# Patient Record
Sex: Male | Born: 2000
Health system: Southern US, Community
[De-identification: ages and names within clinical notes are randomized; demographics above are authoritative.]

## PROBLEM LIST (undated history)

## (undated) ENCOUNTER — Emergency Department (HOSPITAL_COMMUNITY): Admission: EM | Payer: Self-pay | Source: Home / Self Care

## (undated) ENCOUNTER — Ambulatory Visit (HOSPITAL_COMMUNITY): Admission: EM | Payer: Medicaid Other | Source: Home / Self Care

## (undated) DIAGNOSIS — E669 Obesity, unspecified: Secondary | ICD-10-CM

## (undated) DIAGNOSIS — R51 Headache: Secondary | ICD-10-CM

## (undated) DIAGNOSIS — R519 Headache, unspecified: Secondary | ICD-10-CM

## (undated) HISTORY — PX: TONSILLECTOMY: SUR1361

## (undated) HISTORY — DX: Headache: R51

## (undated) HISTORY — DX: Headache, unspecified: R51.9

---
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
## 2020-01-16 DIAGNOSIS — Z419 Encounter for procedure for purposes other than remedying health state, unspecified: Secondary | ICD-10-CM | POA: Diagnosis not present

## 2020-02-16 DIAGNOSIS — Z419 Encounter for procedure for purposes other than remedying health state, unspecified: Secondary | ICD-10-CM | POA: Diagnosis not present

## 2020-03-18 DIAGNOSIS — Z419 Encounter for procedure for purposes other than remedying health state, unspecified: Secondary | ICD-10-CM | POA: Diagnosis not present

## 2020-04-17 DIAGNOSIS — Z419 Encounter for procedure for purposes other than remedying health state, unspecified: Secondary | ICD-10-CM | POA: Diagnosis not present

## 2020-05-18 DIAGNOSIS — Z419 Encounter for procedure for purposes other than remedying health state, unspecified: Secondary | ICD-10-CM | POA: Diagnosis not present

## 2020-06-17 DIAGNOSIS — Z419 Encounter for procedure for purposes other than remedying health state, unspecified: Secondary | ICD-10-CM | POA: Diagnosis not present

## 2020-07-18 DIAGNOSIS — Z419 Encounter for procedure for purposes other than remedying health state, unspecified: Secondary | ICD-10-CM | POA: Diagnosis not present

## 2020-08-18 DIAGNOSIS — Z419 Encounter for procedure for purposes other than remedying health state, unspecified: Secondary | ICD-10-CM | POA: Diagnosis not present

## 2020-09-15 DIAGNOSIS — Z419 Encounter for procedure for purposes other than remedying health state, unspecified: Secondary | ICD-10-CM | POA: Diagnosis not present

## 2020-10-16 DIAGNOSIS — Z419 Encounter for procedure for purposes other than remedying health state, unspecified: Secondary | ICD-10-CM | POA: Diagnosis not present

## 2020-11-15 DIAGNOSIS — Z419 Encounter for procedure for purposes other than remedying health state, unspecified: Secondary | ICD-10-CM | POA: Diagnosis not present

## 2020-11-19 ENCOUNTER — Encounter (INDEPENDENT_AMBULATORY_CARE_PROVIDER_SITE_OTHER): Payer: Self-pay

## 2020-12-16 DIAGNOSIS — Z419 Encounter for procedure for purposes other than remedying health state, unspecified: Secondary | ICD-10-CM | POA: Diagnosis not present

## 2021-01-15 DIAGNOSIS — Z419 Encounter for procedure for purposes other than remedying health state, unspecified: Secondary | ICD-10-CM | POA: Diagnosis not present

## 2021-01-26 ENCOUNTER — Other Ambulatory Visit: Payer: Self-pay

## 2021-01-26 ENCOUNTER — Ambulatory Visit (HOSPITAL_COMMUNITY)
Admission: EM | Admit: 2021-01-26 | Discharge: 2021-01-26 | Disposition: A | Discharge status: Home / Self Care | Payer: Medicaid Other | Attending: Internal Medicine | Admitting: Internal Medicine

## 2021-01-26 ENCOUNTER — Encounter (HOSPITAL_COMMUNITY): Payer: Self-pay

## 2021-01-26 DIAGNOSIS — R0789 Other chest pain: Secondary | ICD-10-CM

## 2021-01-26 DIAGNOSIS — R109 Unspecified abdominal pain: Secondary | ICD-10-CM

## 2021-01-26 MED ORDER — DICYCLOMINE HCL 20 MG PO TABS: 20.0000 mg | ORAL_TABLET | Freq: Two times a day (BID) | ORAL | 0 refills | Status: DC | PRN

## 2021-01-26 NOTE — ED Triage Notes (Addendum)
Pt c/o pain in lower abdomen off and on for years, and pain in left side of chest off and on for months. States chest pain recently got worse on Saturday, with a stabbing pain on the left side of chest. States chest pain was previously transient in nature but that this recent pain was more consistent and worsened with movement. Denies any active pain in chest or abdomen.  Pt also endorses pain in back of right shoulder. Also reports some trouble using the bathroom. Pt reports has tried taking prilosec, tums without relief and pepto bismol with some relief of abdominal pain.

## 2021-01-26 NOTE — Discharge Instructions (Addendum)
Please call cardiology and gastroenterology numbers provided tomorrow to make appointment for further evaluation and management of chronic chest pain and chronic abdominal pain.  Primary care assistance has been requested for you.  Someone will reach out to you to schedule an appointment for primary care physician.  Please take prescribed medication as needed for abdominal cramping.  Please go to the hospital if any symptoms including chest pain, shortness of breath, abdominal pain worsen.

## 2021-01-26 NOTE — ED Provider Notes (Signed)
[facility]    CSN: [id number] Arrival date & time: 01/26/21  1648      History   Chief Complaint Chief Complaint  Patient presents with   Chest Pain   Abdominal Pain   Shoulder Pain    HPI Larry Reed is a 20 y.o. male.   Patient presents with intermittent chest pain that has been present for approximately 2 years.  Chest pain is intermittent and is described as a sharp pain that occurs to the left side of the chest.  Pain occurs with taking deep breaths at times.  Pain also occurs with movement at times.  Patient denies any current chest pain.  Denies any shortness of breath.  Denies any blurred vision, nausea, headache, dizziness.  Denies any cardiac history or significant lung history.  Patient also presents for evaluation of generalized abdominal pain that has been present for multiple years as well.  Patient describes the abdominal pain as generalized and cramping.  Pain worsens when eating.  Denies any constipation but states that he does have diarrhea approximately 4 times a day that has also been present for multiple years.  Denies any blood in stool.  Denies any nausea or vomiting.  Denies any urinary burning or frequency.  Patient states that he has been meaning to have these problems evaluated by primary care physician but does not have one.  Patient is requesting assistance finding a primary care physician.   Chest Pain Abdominal Pain Shoulder Pain  Past Medical History:  Diagnosis Date   Headache    Obesity     Patient Active Problem List   Diagnosis Date Noted   Migraine with aura and without status migrainosus 10/29/2018   Episodic tension-type headache, not intractable 10/29/2018    Past Surgical History:  Procedure Laterality Date   TONSILLECTOMY         Home Medications    Prior to Admission medications   Medication Sig Start Date End Date Taking? Authorizing Provider  dicyclomine (BENTYL) 20 MG tablet Take 1 tablet (20 mg  total) by mouth 2 (two) times daily as needed for spasms (Abdominal pain and cramping). 01/26/21  Yes Lance Muss, FNP  ibuprofen (ADVIL,MOTRIN) 600 MG tablet Take 1 tablet (600 mg total) by mouth every 6 (six) hours as needed. 05/07/18  Yes Linus Mako B, NP  MIGRELIEF 200-180-50 MG TABS Take 2 tablets daily 10/29/18   Deetta Perla, MD  ondansetron (ZOFRAN ODT) 4 MG disintegrating tablet Take 1 tablet (4 mg total) by mouth every 6 (six) hours as needed. 04/25/19   Ward, Layla Maw, DO  oxyCODONE-acetaminophen (PERCOCET/ROXICET) 5-325 MG tablet Take 1 tablet by mouth every 4 (four) hours as needed. 04/25/19   Ward, Layla Maw, DO    Family History Family History  Problem Relation Age of Onset   Heart attack Maternal Grandfather    Hypertension Paternal Grandfather    Heart attack Paternal Grandfather     Social History Social History   Tobacco Use   Smoking status: Some Days    Pack years: 0.00    Types: Cigarettes   Smokeless tobacco: Former    Types: Associate Professor Use: Every day   Substances: Nicotine  Substance Use Topics   Alcohol use: No    Alcohol/week: 0.0 standard drinks   Drug use: Yes    Types: Marijuana    Comment: smokes marijuana daily     Allergies   Patient has no known  allergies.   Review of Systems Review of Systems Per HPI  Physical Exam Triage Vital Signs ED Triage Vitals  Enc Vitals Group     BP 01/26/21 1906 (S) (!) 121/50     Pulse Rate 01/26/21 1906 75     Resp 01/26/21 1906 18     Temp 01/26/21 1906 98.2 F (36.8 C)     Temp src --      SpO2 01/26/21 1906 100 %     Weight --      Height --      Head Circumference --      Peak Flow --      Pain Score 01/26/21 1908 0     Pain Loc --      Pain Edu? --      Excl. in GC? --    No data found.  Updated Vital Signs BP (S) (!) 121/50   Pulse 75   Temp 98.2 F (36.8 C)   Resp 18   SpO2 100%   Visual Acuity Right Eye Distance:   Left Eye Distance:    Bilateral Distance:    Right Eye Near:   Left Eye Near:    Bilateral Near:     Physical Exam Constitutional:      General: He is not in acute distress.    Appearance: Normal appearance. He is obese. He is not ill-appearing or toxic-appearing.  HENT:     Head: Normocephalic and atraumatic.  Eyes:     Extraocular Movements: Extraocular movements intact.     Conjunctiva/sclera: Conjunctivae normal.  Cardiovascular:     Rate and Rhythm: Normal rate and regular rhythm.     Heart sounds: Normal heart sounds.  Pulmonary:     Effort: Pulmonary effort is normal.     Breath sounds: Normal breath sounds.  Chest:     Chest wall: No tenderness.  Abdominal:     General: Bowel sounds are normal.     Palpations: Abdomen is soft.     Tenderness: There is no abdominal tenderness.  Skin:    General: Skin is warm and dry.  Neurological:     General: No focal deficit present.     Mental Status: He is alert and oriented to person, place, and time. Mental status is at baseline.  Psychiatric:        Mood and Affect: Mood normal.        Behavior: Behavior normal.        Thought Content: Thought content normal.        Judgment: Judgment normal.     UC Treatments / Results  Labs (all labs ordered are listed, but only abnormal results are displayed) Labs Reviewed - No data to display  EKG   Radiology No results found.  Procedures Procedures (including critical care time)  Medications Ordered in UC Medications - No data to display  Initial Impression / Assessment and Plan / UC Course  I have reviewed the triage vital signs and the nursing notes.  Pertinent labs & imaging results that were available during my care of the patient were reviewed by me and considered in my medical decision making (see chart for details).     EKG showing sinus rhythm with sinus arrhythmia.  Possible differential diagnosis for left chest pain include pleurisy, muscular pain, or pain related to sinus  arrhythmia.  Low suspicion due to clinical signs and symptoms of pain that is related to cardiac reasoning.  Due to chronicity of pain  and no current chest pain, do not think patient needs to be mediately further evaluated at the hospital at this time.  Referral to cardiology for further evaluation and management.  Patient was given contact information for cardiologist.  Clinical signs and symptoms and mechanism of abdominal pain most consistent with irritable bowel syndrome or chronic gastrointestinal problems such as Crohn's.  Patient was prescribed Bentyl as needed for abdominal pain and cramping.  Referral made to gastroenterology.  Patient was given contact information to follow-up to be further evaluated.  Patient was advised to go to the hospital if any chest pain or abdominal pain significantly worsens.  Patient was also advised to go to the hospital if any shortness of breath develop.  PCP assistance was requested for patient.  Discussed strict return precautions. Patient verbalized understanding and is agreeable with plan.  Final Clinical Impressions(s) / UC Diagnoses   Final diagnoses:  Other chest pain  Abdominal cramping     Discharge Instructions      Please call cardiology and gastroenterology numbers provided tomorrow to make appointment for further evaluation and management of chronic chest pain and chronic abdominal pain.  Primary care assistance has been requested for you.  Someone will reach out to you to schedule an appointment for primary care physician.  Please take prescribed medication as needed for abdominal cramping.  Please go to the hospital if any symptoms including chest pain, shortness of breath, abdominal pain worsen.     ED Prescriptions     Medication Sig Dispense Auth. Provider   dicyclomine (BENTYL) 20 MG tablet Take 1 tablet (20 mg total) by mouth 2 (two) times daily as needed for spasms (Abdominal pain and cramping). 20 tablet Lance Muss,  FNP      PDMP not reviewed this encounter.   Lance Muss, FNP 01/26/21 2203

## 2021-01-27 ENCOUNTER — Encounter (HOSPITAL_COMMUNITY): Payer: Self-pay

## 2021-02-15 DIAGNOSIS — Z419 Encounter for procedure for purposes other than remedying health state, unspecified: Secondary | ICD-10-CM | POA: Diagnosis not present

## 2021-03-10 ENCOUNTER — Emergency Department (HOSPITAL_COMMUNITY)
Admission: EM | Admit: 2021-03-10 | Discharge: 2021-03-10 | Disposition: A | Discharge status: Home / Self Care | Payer: Medicaid Other | Attending: Emergency Medicine | Admitting: Emergency Medicine

## 2021-03-10 ENCOUNTER — Emergency Department (HOSPITAL_COMMUNITY): Payer: Medicaid Other

## 2021-03-10 ENCOUNTER — Encounter (HOSPITAL_COMMUNITY): Payer: Self-pay | Admitting: Emergency Medicine

## 2021-03-10 ENCOUNTER — Other Ambulatory Visit: Payer: Self-pay

## 2021-03-10 DIAGNOSIS — R0602 Shortness of breath: Secondary | ICD-10-CM | POA: Diagnosis not present

## 2021-03-10 DIAGNOSIS — R0789 Other chest pain: Secondary | ICD-10-CM | POA: Insufficient documentation

## 2021-03-10 DIAGNOSIS — F17210 Nicotine dependence, cigarettes, uncomplicated: Secondary | ICD-10-CM | POA: Diagnosis not present

## 2021-03-10 DIAGNOSIS — R079 Chest pain, unspecified: Secondary | ICD-10-CM | POA: Diagnosis not present

## 2021-03-10 LAB — COMPREHENSIVE METABOLIC PANEL
ALT: 19 U/L (ref 0–44)
AST: 15 U/L (ref 15–41)
Albumin: 4.2 g/dL (ref 3.5–5.0)
Alkaline Phosphatase: 85 U/L (ref 38–126)
Anion gap: 7 (ref 5–15)
BUN: 10 mg/dL (ref 6–20)
CO2: 26 mmol/L (ref 22–32)
Calcium: 8.9 mg/dL (ref 8.9–10.3)
Chloride: 106 mmol/L (ref 98–111)
Creatinine, Ser: 0.74 mg/dL (ref 0.61–1.24)
GFR, Estimated: >60 mL/min (ref >60)
Glucose, Bld: 97 mg/dL (ref 70–99)
Potassium: 4 mmol/L (ref 3.5–5.1)
Sodium: 139 mmol/L (ref 135–145)
Total Bilirubin: 0.5 mg/dL (ref 0.3–1.2)
Total Protein: 7.2 g/dL (ref 6.5–8.1)

## 2021-03-10 LAB — CBC
HCT: 47.4 % (ref 39.0–52.0)
Hemoglobin: 15.5 g/dL (ref 13.0–17.0)
MCH: 29.3 pg (ref 26.0–34.0)
MCHC: 32.7 g/dL (ref 30.0–36.0)
MCV: 89.6 fL (ref 80.0–100.0)
Platelets: 297 10*3/uL (ref 150–400)
RBC: 5.29 MIL/uL (ref 4.22–5.81)
RDW: 12.5 % (ref 11.5–15.5)
WBC: 9.7 10*3/uL (ref 4.0–10.5)
nRBC: 0 % (ref 0.0–0.2)

## 2021-03-10 LAB — LIPASE, BLOOD: Lipase: 27 U/L (ref 11–51)

## 2021-03-10 LAB — TROPONIN I (HIGH SENSITIVITY)
Troponin I (High Sensitivity): <2 ng/L (ref <18)
Troponin I (High Sensitivity): <2 ng/L (ref <18)

## 2021-03-10 MED ORDER — SODIUM CHLORIDE 0.9 % IV BOLUS
1000.0000 mL | Freq: Once | INTRAVENOUS | Status: AC
  Administered 2021-03-10: 1000 mL via INTRAVENOUS

## 2021-03-10 MED ORDER — ONDANSETRON 4 MG PO TBDP: 4.0000 mg | ORAL_TABLET | Freq: Three times a day (TID) | ORAL | 0 refills | Status: DC | PRN

## 2021-03-10 MED ORDER — ONDANSETRON HCL 4 MG/2ML IJ SOLN
4.0000 mg | Freq: Once | INTRAMUSCULAR | Status: AC
  Administered 2021-03-10: 4 mg via INTRAVENOUS
  Filled 2021-03-10: qty 2

## 2021-03-10 MED ORDER — SODIUM CHLORIDE 0.9 % IV SOLN
12.5000 mg | Freq: Once | INTRAVENOUS | Status: DC
  Filled 2021-03-10: qty 0.5

## 2021-03-10 MED ORDER — NAPROXEN 250 MG PO TABS
500.0000 mg | ORAL_TABLET | Freq: Once | ORAL | Status: AC
  Administered 2021-03-10: 500 mg via ORAL
  Filled 2021-03-10: qty 2

## 2021-03-10 NOTE — Discharge Instructions (Addendum)
It was a pleasure taking care of you today. As discussed, all of your labs were reassuring. You heart labs were all normal. I have included the number of the cardiologist. Call to schedule an appointment for further evaluation. Follow-up with PCP within the next week. Return to the ER for new or worsening symptoms.  I am sending you home with nausea medication. Take as needed for nausea.

## 2021-03-10 NOTE — ED Provider Notes (Signed)
[facility] EMERGENCY DEPARTMENT Provider Note   CSN: [id number] Arrival date & time: 03/10/21  1275     History Chief Complaint  Patient presents with   Chest Pain    Larry Reed is a 20 y.o. male who presents to the ED due to left-sided chest.  Patient notes chest pain has been intermittent for the past few months.  Patient was evaluated at urgent care beginning of July where he was referred to cardiology.  Patient states that this morning around 9 AM he became acutely dizzy and nauseous and began having left-sided, nonradiating chest pain.  Patient describes chest pain as his "left breast being squeezed". Patient endorses mild associated shortness of chest pain.  Patient states pain is worse when lifting left arm. Mild pleuritic nature. No history of PE. Denies associated lower extremities edema. He notes he has not been seen by cardiology yet. No treatment prior to arrival. Patient is currently chest pain free.   History obtained from patient and past medical records. No interpreter used during encounter.       Past Medical History:  Diagnosis Date   Headache    Obesity     Patient Active Problem List   Diagnosis Date Noted   Migraine with aura and without status migrainosus 10/29/2018   Episodic tension-type headache, not intractable 10/29/2018    Past Surgical History:  Procedure Laterality Date   TONSILLECTOMY         Family History  Problem Relation Age of Onset   Heart attack Maternal Grandfather    Hypertension Paternal Grandfather    Heart attack Paternal Grandfather     Social History   Tobacco Use   Smoking status: Some Days    Packs/day: 0.50    Types: Cigarettes   Smokeless tobacco: Former    Types: Associate Professor Use: Every day   Substances: Nicotine  Substance Use Topics   Alcohol use: No    Alcohol/week: 0.0 standard drinks   Drug use: Yes    Types: Marijuana    Comment: smokes marijuana daily    Home  Medications Prior to Admission medications   Medication Sig Start Date End Date Taking? Authorizing Provider  ibuprofen (ADVIL) 200 MG tablet Take 200 mg by mouth every 6 (six) hours as needed for fever, mild pain or headache.   Yes [provider]  ondansetron (ZOFRAN ODT) 4 MG disintegrating tablet Take 1 tablet (4 mg total) by mouth every 8 (eight) hours as needed for nausea or vomiting. 03/10/21  Yes Rueben Kassim, Merla Riches, PA-C  dicyclomine (BENTYL) 20 MG tablet Take 1 tablet (20 mg total) by mouth 2 (two) times daily as needed for spasms (Abdominal pain and cramping). Patient not taking: Reported on 03/10/2021 01/26/21   Lance Muss, FNP  ibuprofen (ADVIL,MOTRIN) 600 MG tablet Take 1 tablet (600 mg total) by mouth every 6 (six) hours as needed. Patient not taking: Reported on 03/10/2021 05/07/18   Linus Mako B, NP  MIGRELIEF 200-180-50 MG TABS Take 2 tablets daily Patient not taking: Reported on 03/10/2021 10/29/18   Deetta Perla, MD  ondansetron (ZOFRAN ODT) 4 MG disintegrating tablet Take 1 tablet (4 mg total) by mouth every 6 (six) hours as needed. Patient not taking: Reported on 03/10/2021 04/25/19   Ward, Layla Maw, DO  oxyCODONE-acetaminophen (PERCOCET/ROXICET) 5-325 MG tablet Take 1 tablet by mouth every 4 (four) hours as needed. Patient not taking: Reported on 03/10/2021 04/25/19   Ward, Baxter Hire  N, DO    Allergies    Patient has no known allergies.  Review of Systems   Review of Systems  Constitutional:  Negative for chills and fever.  Respiratory:  Positive for shortness of breath. Negative for cough.   Cardiovascular:  Positive for chest pain. Negative for leg swelling.  Gastrointestinal:  Positive for nausea. Negative for abdominal pain, diarrhea and vomiting.  All other systems reviewed and are negative.  Physical Exam Updated Vital Signs BP 115/61   Pulse 60   Temp 98 F (36.7 C) (Oral)   Resp 16   Ht 5\' 6"  (1.676 m)   Wt 122.5 kg   SpO2 100%   BMI  43.58 kg/m   Physical Exam Vitals and nursing note reviewed.  Constitutional:      General: He is not in acute distress.    Appearance: He is not ill-appearing.  HENT:     Head: Normocephalic.  Eyes:     Pupils: Pupils are equal, round, and reactive to light.  Cardiovascular:     Rate and Rhythm: Normal rate and regular rhythm.     Pulses: Normal pulses.     Heart sounds: Normal heart sounds. No murmur heard.   No friction rub. No gallop.  Pulmonary:     Effort: Pulmonary effort is normal.     Breath sounds: Normal breath sounds.  Chest:     Comments: Reproducible left-sided chest wall tenderness without crepitus or deformity.  Abdominal:     General: Abdomen is flat. There is no distension.     Palpations: Abdomen is soft.     Tenderness: no abdominal tenderness There is no guarding or rebound.  Musculoskeletal:        General: Normal range of motion.     Cervical back: Neck supple.     Comments: No lower extremity edema. Negative homan sign bilaterally  Skin:    General: Skin is warm and dry.  Neurological:     General: No focal deficit present.     Mental Status: He is alert.  Psychiatric:        Mood and Affect: Mood normal.        Behavior: Behavior normal.    ED Results / Procedures / Treatments   Labs (all labs ordered are listed, but only abnormal results are displayed) Labs Reviewed  CBC  COMPREHENSIVE METABOLIC PANEL  LIPASE, BLOOD  TROPONIN I (HIGH SENSITIVITY)  TROPONIN I (HIGH SENSITIVITY)    EKG None  Radiology DG Chest 2 View  Result Date: 03/10/2021 CLINICAL DATA:  20 year old male with history of left-sided chest pain intermittently. EXAM: CHEST - 2 VIEW COMPARISON:  Chest x-ray 04/25/2019. FINDINGS: Lung volumes are normal. No consolidative airspace disease. No pleural effusions. No pneumothorax. No pulmonary nodule or mass noted. Pulmonary vasculature and the cardiomediastinal silhouette are within normal limits. IMPRESSION: No radiographic  evidence of acute cardiopulmonary disease. Electronically Signed   By: 06/25/2019 M.D.   On: 03/10/2021 10:33    Procedures Procedures   Medications Ordered in ED Medications  promethazine (PHENERGAN) 12.5 mg in sodium chloride 0.9 % 50 mL IVPB (has no administration in time range)  sodium chloride 0.9 % bolus 1,000 mL (1,000 mLs Intravenous New Bag/Given 03/10/21 1411)  ondansetron (ZOFRAN) injection 4 mg (4 mg Intravenous Given 03/10/21 1411)  naproxen (NAPROSYN) tablet 500 mg (500 mg Oral Given 03/10/21 1406)    ED Course  I have reviewed the triage vital signs and the nursing notes.  Pertinent  labs & imaging results that were available during my care of the patient were reviewed by me and considered in my medical decision making (see chart for details).    MDM Rules/Calculators/A&P                          20 year old male presents to the ED due to left-sided chest pain that has been intermittent for the past few months. Chest pain worse when raising left arm. He is a Psychiatric nurse. Admits to mild shortness of breath and nausea with chest pain. Patient was evaluated at [facility] beginning of July for same complaint and referred to cardiology who patient has not followed up with yet. Upon arrival, vitals all within normal limits. Patient is afebrile, not tachycardic or hypoxic. Patient in no acute distress. Physical exam significant for reproducible left-sided chest pain without crepitus or deformity. No lower extremity edema. PERC negative and low risk using well's criteria. Low suspicion for PE/DVT. Cardiac and abdominal labs ordered at triage. IVFs and Zofran given. Possible MSK etiology given reproducible nature on exam. Naproxen given.   CBC and CMP unremarkable. No leukocytosis. Normal renal function.  No major electrolyte derangements.  Delta troponin flat.  Low suspicion for ACS.  Lipase normal.  Discussion of pancreatitis.  Chest x-ray is negative for any acute abnormalities.  EKG  returns normal sinus rhythm with sinus arrhythmia.  No acute ST changes. Patient endorses improvement after nausea medication, IVFs, and naproxen. Presentation  non-concerning for dissection. Low suspicion for cardiac etiology given age and lack of risk factors; however, given longevity of symptoms, will discharge patient with cardiology referral. Advised patient to call to schedule an appointment for further evaluation. Strict ED precautions discussed with patient. Patient states understanding and agrees to plan. Patient discharged home in no acute distress and stable vitals  Final Clinical Impression(s) / ED Diagnoses Final diagnoses:  Atypical chest pain    Rx / DC Orders ED Discharge Orders          Ordered    ondansetron (ZOFRAN ODT) 4 MG disintegrating tablet  Every 8 hours PRN        03/10/21 1524             Jesusita Oka 03/10/21 1544    Bethann Berkshire, MD 03/12/21 [phone]

## 2021-03-10 NOTE — ED Triage Notes (Signed)
Pt to the ED with Left side chest discomfort with nausea and vomiting that began at 0900 this morning.  Pt states this intermittent chest pain for the last 2-3 months.

## 2021-03-18 DIAGNOSIS — Z419 Encounter for procedure for purposes other than remedying health state, unspecified: Secondary | ICD-10-CM | POA: Diagnosis not present

## 2021-04-17 DIAGNOSIS — Z419 Encounter for procedure for purposes other than remedying health state, unspecified: Secondary | ICD-10-CM | POA: Diagnosis not present

## 2021-04-20 ENCOUNTER — Ambulatory Visit: Payer: Medicaid Other | Admitting: Cardiovascular Disease

## 2021-05-18 DIAGNOSIS — Z419 Encounter for procedure for purposes other than remedying health state, unspecified: Secondary | ICD-10-CM | POA: Diagnosis not present

## 2021-06-17 DIAGNOSIS — Z419 Encounter for procedure for purposes other than remedying health state, unspecified: Secondary | ICD-10-CM | POA: Diagnosis not present

## 2021-07-18 DIAGNOSIS — Z419 Encounter for procedure for purposes other than remedying health state, unspecified: Secondary | ICD-10-CM | POA: Diagnosis not present

## 2021-08-18 DIAGNOSIS — Z419 Encounter for procedure for purposes other than remedying health state, unspecified: Secondary | ICD-10-CM | POA: Diagnosis not present

## 2021-09-15 DIAGNOSIS — Z419 Encounter for procedure for purposes other than remedying health state, unspecified: Secondary | ICD-10-CM | POA: Diagnosis not present

## 2021-10-16 DIAGNOSIS — Z419 Encounter for procedure for purposes other than remedying health state, unspecified: Secondary | ICD-10-CM | POA: Diagnosis not present

## 2021-11-15 DIAGNOSIS — Z419 Encounter for procedure for purposes other than remedying health state, unspecified: Secondary | ICD-10-CM | POA: Diagnosis not present

## 2021-12-16 DIAGNOSIS — Z419 Encounter for procedure for purposes other than remedying health state, unspecified: Secondary | ICD-10-CM | POA: Diagnosis not present

## 2022-01-15 DIAGNOSIS — Z419 Encounter for procedure for purposes other than remedying health state, unspecified: Secondary | ICD-10-CM | POA: Diagnosis not present

## 2022-02-15 DIAGNOSIS — Z419 Encounter for procedure for purposes other than remedying health state, unspecified: Secondary | ICD-10-CM | POA: Diagnosis not present

## 2022-03-18 DIAGNOSIS — Z419 Encounter for procedure for purposes other than remedying health state, unspecified: Secondary | ICD-10-CM | POA: Diagnosis not present

## 2022-04-17 DIAGNOSIS — Z419 Encounter for procedure for purposes other than remedying health state, unspecified: Secondary | ICD-10-CM | POA: Diagnosis not present

## 2022-05-18 DIAGNOSIS — Z419 Encounter for procedure for purposes other than remedying health state, unspecified: Secondary | ICD-10-CM | POA: Diagnosis not present

## 2022-06-17 DIAGNOSIS — Z419 Encounter for procedure for purposes other than remedying health state, unspecified: Secondary | ICD-10-CM | POA: Diagnosis not present

## 2022-07-18 DIAGNOSIS — Z419 Encounter for procedure for purposes other than remedying health state, unspecified: Secondary | ICD-10-CM | POA: Diagnosis not present

## 2022-12-26 IMAGING — DX DG CHEST 2V
2 series · 2 of 2 positions shown · non-contrast
Comparison: Chest x-ray 04/25/2019.

CLINICAL DATA: 20-year-old male with history of left-sided chest
pain intermittently.

EXAM:
CHEST - 2 VIEW

[chest pa]
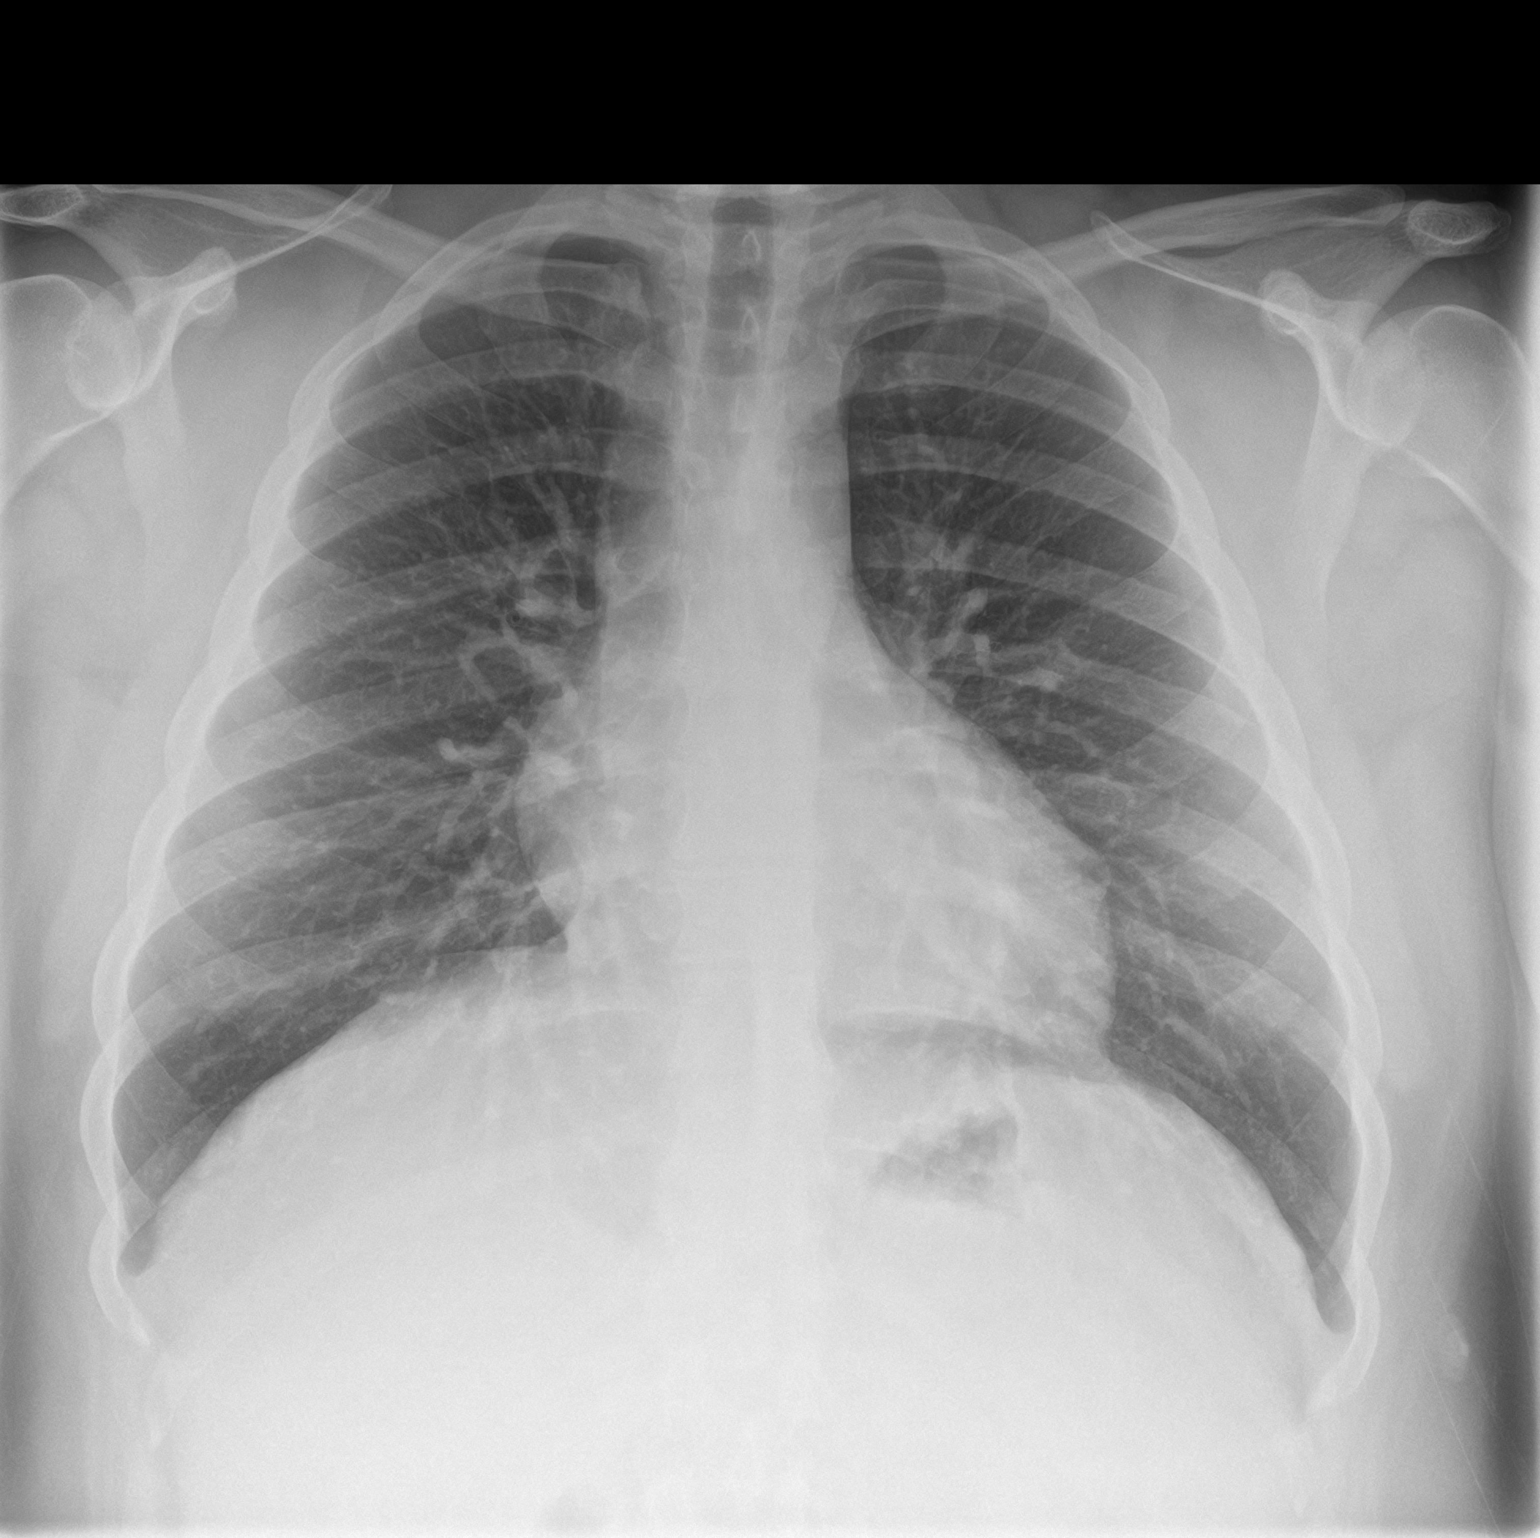

[chest lat]
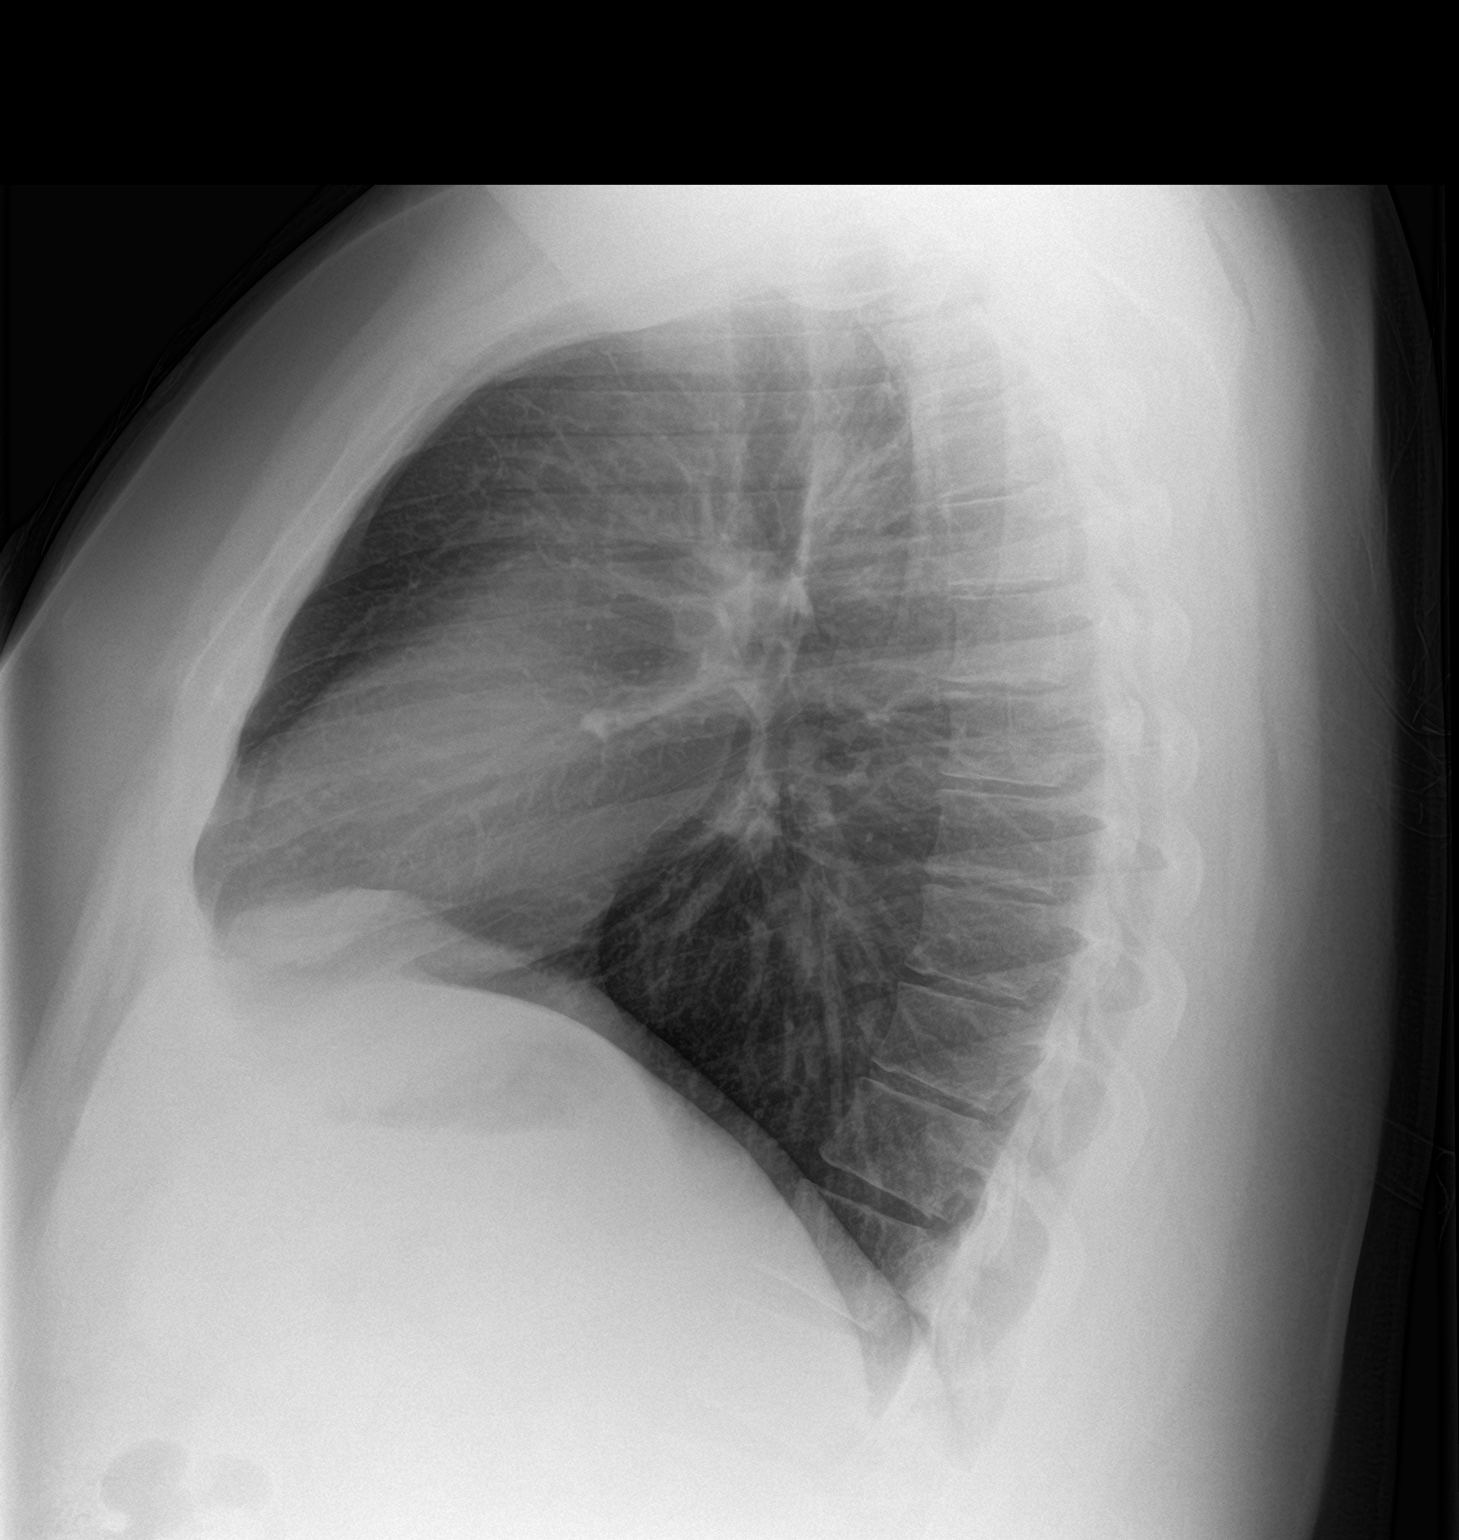

[2 of 2 positions shown; findings below may reference images not displayed]

FINDINGS: Lung volumes are normal. No consolidative airspace disease. No
pleural effusions. No pneumothorax. No pulmonary nodule or mass
noted. Pulmonary vasculature and the cardiomediastinal silhouette
are within normal limits.
IMPRESSION: No radiographic evidence of acute cardiopulmonary disease.

## 2023-05-21 ENCOUNTER — Other Ambulatory Visit: Payer: Self-pay

## 2023-05-21 ENCOUNTER — Encounter (HOSPITAL_COMMUNITY): Payer: Self-pay | Admitting: Emergency Medicine

## 2023-05-21 ENCOUNTER — Emergency Department (HOSPITAL_COMMUNITY)
Admission: EM | Admit: 2023-05-21 | Discharge: 2023-05-21 | Disposition: A | Discharge status: Home / Self Care | Payer: 59 | Attending: Emergency Medicine | Admitting: Emergency Medicine

## 2023-05-21 DIAGNOSIS — M545 Low back pain, unspecified: Secondary | ICD-10-CM | POA: Diagnosis present

## 2023-05-21 MED ORDER — KETOROLAC TROMETHAMINE 60 MG/2ML IM SOLN
60.0000 mg | Freq: Once | INTRAMUSCULAR | Status: AC
  Administered 2023-05-21: 60 mg via INTRAMUSCULAR
  Filled 2023-05-21: qty 2

## 2023-05-21 MED ORDER — DEXAMETHASONE SODIUM PHOSPHATE 10 MG/ML IJ SOLN
10.0000 mg | Freq: Once | INTRAMUSCULAR | Status: AC
  Administered 2023-05-21: 10 mg via INTRAMUSCULAR
  Filled 2023-05-21: qty 1

## 2023-05-21 MED ORDER — OXYCODONE-ACETAMINOPHEN 5-325 MG PO TABS: 1.0000 | ORAL_TABLET | Freq: Four times a day (QID) | ORAL | 0 refills | Status: DC | PRN

## 2023-05-21 MED ORDER — METHOCARBAMOL 500 MG PO TABS: 500.0000 mg | ORAL_TABLET | Freq: Three times a day (TID) | ORAL | 0 refills | Status: DC | PRN

## 2023-05-21 MED ORDER — IBUPROFEN 800 MG PO TABS: 800.0000 mg | ORAL_TABLET | Freq: Four times a day (QID) | ORAL | 0 refills | Status: AC | PRN

## 2023-05-21 NOTE — ED Provider Notes (Signed)
[place] EMERGENCY DEPARTMENT AT [facility] Provider Note   CSN: [id number] Arrival date & time: 05/21/23  0349     History  Chief Complaint  Patient presents with   Back Pain    Larry Reed is a 22 y.o. male.  Patient presents to the emergency department for evaluation of low back pain.  Patient reports that symptoms have been present for 4 to 5 days.  He reports that it was at its worst a couple of days ago and then seem to get better.  Tonight, however, he woke up from sleep with significant pain.  Pain worsens with any movement of the lower back.  Pain does not radiate to the legs.  No change in bowel or bladder function.       Home Medications Prior to Admission medications   Medication Sig Start Date End Date Taking? Authorizing Provider  ibuprofen (ADVIL) 800 MG tablet Take 1 tablet (800 mg total) by mouth every 6 (six) hours as needed for moderate pain (pain score 4-6). 05/21/23  Yes Equilla Que, Canary Brim, MD  methocarbamol (ROBAXIN) 500 MG tablet Take 1 tablet (500 mg total) by mouth every 8 (eight) hours as needed for muscle spasms. 05/21/23  Yes Burdell Peed, Canary Brim, MD  oxyCODONE-acetaminophen (PERCOCET/ROXICET) 5-325 MG tablet Take 1 tablet by mouth every 6 (six) hours as needed for severe pain (pain score 7-10). 05/21/23  Yes Chrysa Rampy, Canary Brim, MD      Allergies    Patient has no known allergies.    Review of Systems   Review of Systems  Physical Exam Updated Vital Signs BP (!) 140/95 (BP Location: Right Arm)   Pulse 80   Temp 97.9 F (36.6 C) (Oral)   Resp 16   Ht 5\' 6"  (1.676 m)   Wt 131.5 kg   SpO2 98%   BMI 46.81 kg/m  Physical Exam Vitals and nursing note reviewed.  Constitutional:      General: He is not in acute distress.    Appearance: He is well-developed.  HENT:     Head: Normocephalic and atraumatic.     Mouth/Throat:     Mouth: Mucous membranes are moist.  Eyes:     General: Vision grossly intact. Gaze  aligned appropriately.     Extraocular Movements: Extraocular movements intact.     Conjunctiva/sclera: Conjunctivae normal.  Cardiovascular:     Rate and Rhythm: Normal rate and regular rhythm.     Pulses: Normal pulses.     Heart sounds: Normal heart sounds, S1 normal and S2 normal. No murmur heard.    No friction rub. No gallop.  Pulmonary:     Effort: Pulmonary effort is normal. No respiratory distress.     Breath sounds: Normal breath sounds.  Abdominal:     Palpations: Abdomen is soft.     Tenderness: There is no abdominal tenderness. There is no guarding or rebound.     Hernia: No hernia is present.  Musculoskeletal:        General: No swelling.     Cervical back: Full passive range of motion without pain, normal range of motion and neck supple. No pain with movement, spinous process tenderness or muscular tenderness. Normal range of motion.     Lumbar back: Tenderness present. Decreased range of motion. Negative right straight leg raise test and negative left straight leg raise test.     Right lower leg: No edema.     Left lower leg: No edema.  Skin:  General: Skin is warm and dry.     Capillary Refill: Capillary refill takes less than 2 seconds.     Findings: No ecchymosis, erythema, lesion or wound.  Neurological:     Mental Status: He is alert and oriented to person, place, and time.     GCS: GCS eye subscore is 4. GCS verbal subscore is 5. GCS motor subscore is 6.     Cranial Nerves: Cranial nerves 2-12 are intact.     Sensory: Sensation is intact.     Motor: Motor function is intact. No weakness or abnormal muscle tone.     Coordination: Coordination is intact.  Psychiatric:        Mood and Affect: Mood normal.        Speech: Speech normal.        Behavior: Behavior normal.     ED Results / Procedures / Treatments   Labs (all labs ordered are listed, but only abnormal results are displayed) Labs Reviewed - No data to display  EKG None  Radiology No  results found.  Procedures Procedures    Medications Ordered in ED Medications  ketorolac (TORADOL) injection 60 mg (has no administration in time range)  dexamethasone (DECADRON) injection 10 mg (has no administration in time range)    ED Course/ Medical Decision Making/ A&P                                 Medical Decision Making  Patient presents to the ER with musculoskeletal back pain. Examination reveals back tenderness without any associated neurologic findings. Patient's strength, sensation and reflexes were normal. There is no evidence of saddle anesthesia. Patient does not have a foot drop. Patient has not experienced any change in bowel or bladder function. As such, patient did not require any imaging or further studies. Patient was treated with analgesia.        Final Clinical Impression(s) / ED Diagnoses Final diagnoses:  Acute bilateral low back pain without sciatica    Rx / DC Orders ED Discharge Orders          Ordered    oxyCODONE-acetaminophen (PERCOCET/ROXICET) 5-325 MG tablet  Every 6 hours PRN        05/21/23 0420    methocarbamol (ROBAXIN) 500 MG tablet  Every 8 hours PRN        05/21/23 0420    ibuprofen (ADVIL) 800 MG tablet  Every 6 hours PRN        05/21/23 0420              Gilda Crease, MD 05/21/23 [phone]

## 2023-05-21 NOTE — ED Triage Notes (Addendum)
Pt with c/o pain along entire back since Wednesday. Denies any known injury but states that he has to get into some "tight" spaces at work. Pt ambulatory to room.

## 2023-06-01 ENCOUNTER — Ambulatory Visit
Admission: EM | Admit: 2023-06-01 | Discharge: 2023-06-01 | Disposition: A | Discharge status: Home / Self Care | Payer: 59 | Attending: Nurse Practitioner | Admitting: Nurse Practitioner

## 2023-06-01 DIAGNOSIS — J019 Acute sinusitis, unspecified: Secondary | ICD-10-CM | POA: Diagnosis not present

## 2023-06-01 LAB — POC COVID19/FLU A&B COMBO
Covid Antigen, POC: NEGATIVE
Influenza A Antigen, POC: NEGATIVE
Influenza B Antigen, POC: NEGATIVE

## 2023-06-01 MED ORDER — AMOXICILLIN-POT CLAVULANATE 875-125 MG PO TABS: 1.0000 | ORAL_TABLET | Freq: Two times a day (BID) | ORAL | 0 refills | Status: AC

## 2023-06-01 NOTE — ED Provider Notes (Signed)
[facility]    CSN: [id number] Arrival date & time: 06/01/23  1534      History   Chief Complaint No chief complaint on file.   HPI Larry Reed is a 22 y.o. male.   Patient presents today with 2-day history of low-grade fevers, body aches and chills, congested cough but nothing will come up, stuffy nose, shortness of breath with activity, sinus pressure worsen he leans forward, abdominal pain, and diarrhea.  Also has had decreased appetite and fatigue.  No chest pain, runny nose, sore throat, ear pain.  Is taking multisymptom Vicks RapidMelts without improvement.    Past Medical History:  Diagnosis Date   Headache    Obesity     Patient Active Problem List   Diagnosis Date Noted   Migraine with aura and without status migrainosus 10/29/2018   Episodic tension-type headache, not intractable 10/29/2018    Past Surgical History:  Procedure Laterality Date   TONSILLECTOMY         Home Medications    Prior to Admission medications   Medication Sig Start Date End Date Taking? Authorizing Provider  amoxicillin-clavulanate (AUGMENTIN) 875-125 MG tablet Take 1 tablet by mouth 2 (two) times daily for 7 days. 06/01/23 06/08/23 Yes Valentino Nose, NP  ibuprofen (ADVIL) 800 MG tablet Take 1 tablet (800 mg total) by mouth every 6 (six) hours as needed for moderate pain (pain score 4-6). 05/21/23   Gilda Crease, MD    Family History Family History  Problem Relation Age of Onset   Heart attack Maternal Grandfather    Hypertension Paternal Grandfather    Heart attack Paternal Grandfather     Social History Social History   Tobacco Use   Smoking status: Some Days    Current packs/day: 0.50    Types: Cigarettes   Smokeless tobacco: Former    Types: Engineer, drilling   Vaping status: Every Day   Substances: Nicotine  Substance Use Topics   Alcohol use: No    Alcohol/week: 0.0 standard drinks of alcohol   Drug use: Yes    Types:  Marijuana    Comment: smokes marijuana daily     Allergies   Patient has no known allergies.   Review of Systems Review of Systems Per HPI  Physical Exam Triage Vital Signs ED Triage Vitals  Encounter Vitals Group     BP 06/01/23 1540 126/83     Systolic BP Percentile --      Diastolic BP Percentile --      Pulse Rate 06/01/23 1540 (!) 110     Resp 06/01/23 1540 19     Temp 06/01/23 1540 98.3 F (36.8 C)     Temp Source 06/01/23 1540 Oral     SpO2 06/01/23 1540 96 %     Weight --      Height --      Head Circumference --      Peak Flow --      Pain Score 06/01/23 1545 0     Pain Loc --      Pain Education --      Exclude from Growth Chart --    No data found.  Updated Vital Signs BP 126/83 (BP Location: Right Arm)   Pulse (!) 110   Temp 98.3 F (36.8 C) (Oral)   Resp 19   SpO2 96%   Visual Acuity Right Eye Distance:   Left Eye Distance:   Bilateral Distance:  Right Eye Near:   Left Eye Near:    Bilateral Near:     Physical Exam Vitals and nursing note reviewed.  Constitutional:      General: He is not in acute distress.    Appearance: Normal appearance. He is not ill-appearing or toxic-appearing.  HENT:     Head: Normocephalic and atraumatic.     Right Ear: Tympanic membrane, ear canal and external ear normal.     Left Ear: Ear canal and external ear normal. Tympanic membrane is erythematous.     Nose: No congestion or rhinorrhea.     Right Sinus: Maxillary sinus tenderness and frontal sinus tenderness present.     Left Sinus: Maxillary sinus tenderness and frontal sinus tenderness present.     Mouth/Throat:     Mouth: Mucous membranes are moist.     Pharynx: Oropharynx is clear. No oropharyngeal exudate or posterior oropharyngeal erythema.  Eyes:     General: No scleral icterus.    Extraocular Movements: Extraocular movements intact.  Cardiovascular:     Rate and Rhythm: Normal rate and regular rhythm.  Pulmonary:     Effort: Pulmonary  effort is normal. No respiratory distress.     Breath sounds: Normal breath sounds. No wheezing, rhonchi or rales.  Abdominal:     General: Abdomen is flat. Bowel sounds are normal. There is no distension.     Palpations: Abdomen is soft.  Musculoskeletal:     Cervical back: Normal range of motion and neck supple. No rigidity.  Lymphadenopathy:     Cervical: No cervical adenopathy.  Skin:    General: Skin is warm and dry.     Coloration: Skin is not jaundiced or pale.     Findings: No erythema or rash.  Neurological:     Mental Status: He is alert and oriented to person, place, and time.  Psychiatric:        Mood and Affect: Mood normal.        Behavior: Behavior normal.      UC Treatments / Results  Labs (all labs ordered are listed, but only abnormal results are displayed) Labs Reviewed  POC COVID19/FLU A&B COMBO    EKG   Radiology No results found.  Procedures Procedures (including critical care time)  Medications Ordered in UC Medications - No data to display  Initial Impression / Assessment and Plan / UC Course  I have reviewed the triage vital signs and the nursing notes.  Pertinent labs & imaging results that were available during my care of the patient were reviewed by me and considered in my medical decision making (see chart for details).   Patient is well-appearing, normotensive, afebrile, not tachycardic, not tachypneic, oxygenating well on room air.    1. Acute non-recurrent sinusitis, unspecified location No red flags in history or on exam Treat with Augmentin twice daily for 7 days Supportive care discussed ER and return precautions discussed Work excuse provided  The patient was given the opportunity to ask questions.  All questions answered to their satisfaction.  The patient is in agreement to this plan.    Final Clinical Impressions(s) / UC Diagnoses   Final diagnoses:  Acute non-recurrent sinusitis, unspecified location     Discharge  Instructions      You have a sinus infection.  Take the Augmentin as prescribed to treat it.  Symptoms should improve over the next week to 10 days.  If you develop chest pain or shortness of breath, go to the emergency  room.  COVID-19 and influenza tests are negative today.  Some things that can make you feel better are: - Increased rest - Increasing fluid with water/sugar free electrolytes - Acetaminophen and ibuprofen as needed for fever/pain - Salt water gargling, chloraseptic spray and throat lozenges - OTC guaifenesin (Mucinex) 600 mg twice daily - Saline sinus flushes or a neti pot - Humidifying the air     ED Prescriptions     Medication Sig Dispense Auth. Provider   amoxicillin-clavulanate (AUGMENTIN) 875-125 MG tablet Take 1 tablet by mouth 2 (two) times daily for 7 days. 14 tablet Valentino Nose, NP      PDMP not reviewed this encounter.   Valentino Nose, NP 06/01/23 [phone]

## 2023-06-01 NOTE — Discharge Instructions (Signed)
You have a sinus infection.  Take the Augmentin as prescribed to treat it.  Symptoms should improve over the next week to 10 days.  If you develop chest pain or shortness of breath, go to the emergency room.  COVID-19 and influenza tests are negative today.  Some things that can make you feel better are: - Increased rest - Increasing fluid with water/sugar free electrolytes - Acetaminophen and ibuprofen as needed for fever/pain - Salt water gargling, chloraseptic spray and throat lozenges - OTC guaifenesin (Mucinex) 600 mg twice daily - Saline sinus flushes or a neti pot - Humidifying the air

## 2023-06-01 NOTE — ED Triage Notes (Signed)
Pt reports he has a cough, nasal congestion, no smell or taste, face feels swollen , and abdominal pain with dark stool x 2 days.   Took 2 covid test both neg

## 2023-07-22 ENCOUNTER — Encounter: Payer: Self-pay | Admitting: Emergency Medicine

## 2023-07-22 ENCOUNTER — Ambulatory Visit
Admission: EM | Admit: 2023-07-22 | Discharge: 2023-07-22 | Disposition: A | Discharge status: Home / Self Care | Payer: No Typology Code available for payment source | Attending: Nurse Practitioner | Admitting: Nurse Practitioner

## 2023-07-22 DIAGNOSIS — J069 Acute upper respiratory infection, unspecified: Secondary | ICD-10-CM

## 2023-07-22 DIAGNOSIS — J029 Acute pharyngitis, unspecified: Secondary | ICD-10-CM | POA: Diagnosis not present

## 2023-07-22 LAB — POCT INFLUENZA A/B
Influenza A, POC: NEGATIVE
Influenza B, POC: NEGATIVE

## 2023-07-22 LAB — POCT RAPID STREP A (OFFICE): Rapid Strep A Screen: NEGATIVE

## 2023-07-22 MED ORDER — FLUTICASONE PROPIONATE 50 MCG/ACT NA SUSP: 2.0000 | Freq: Every day | NASAL | 0 refills | Status: AC

## 2023-07-22 MED ORDER — PROMETHAZINE-DM 6.25-15 MG/5ML PO SYRP: 5.0000 mL | ORAL_SOLUTION | Freq: Four times a day (QID) | ORAL | 0 refills | Status: AC | PRN

## 2023-07-22 MED ORDER — LIDOCAINE VISCOUS HCL 2 % MT SOLN: OROMUCOSAL | 0 refills | Status: AC

## 2023-07-22 NOTE — ED Triage Notes (Signed)
 Itchy throat and dry cough on Tuesday.  Nasal congestion started last night.

## 2023-07-22 NOTE — Discharge Instructions (Addendum)
 The rapid strep test and influenza test were negative.  A throat culture is pending.  You will be contacted if the pending test result is abnormal.  You also have access to the results via MyChart. Take medication as prescribed. Increase fluids and allow for plenty of rest. Recommend use of normal saline nasal spray as needed for nasal congestion and runny nose. Warm salt water gargles 3-4 times daily as needed for throat pain or discomfort. For your cough, recommend using a humidifier in your bedroom at nighttime during sleep and sleeping slightly elevated on pillows while cough symptoms persist. If symptoms do not improve over the next several days, or appear to be worsening, you may follow-up in this clinic or with your primary care physician for further evaluation. Follow-up as needed.

## 2023-07-22 NOTE — ED Provider Notes (Signed)
 [facility]    CSN: [id number] Arrival date & time: 07/22/23  1145      History   Chief Complaint Chief Complaint  Patient presents with   Cough   Nasal Congestion    HPI Larry Reed is a 23 y.o. male.   The history is provided by the patient.   Patient presents for complaints of sore throat, nasal congestion, cough, and fatigue.  Symptoms started with sore throat and a dry cough approximately 3 to 4 days ago.  He states over the past 24 hours, he has developed nasal congestion and worsening cough.  Patient denies fever, chills, headache, ear pain, ear drainage, wheezing, difficulty breathing, chest pain, abdominal pain, nausea, vomiting, diarrhea, or rash.  Patient reports that he has been taking over-the-counter Delsym for cough.  Reports that he did take 2 tablets of an old prescription of amoxicillin , but girlfriend told him to stop the medication, which she did.  Past Medical History:  Diagnosis Date   Headache    Obesity     Patient Active Problem List   Diagnosis Date Noted   Migraine with aura and without status migrainosus 10/29/2018   Episodic tension-type headache, not intractable 10/29/2018    Past Surgical History:  Procedure Laterality Date   TONSILLECTOMY         Home Medications    Prior to Admission medications   Medication Sig Start Date End Date Taking? Authorizing Provider  fluticasone  (FLONASE ) 50 MCG/ACT nasal spray Place 2 sprays into both nostrils daily. 07/22/23  Yes Leath-Warren, Etta PARAS, NP  lidocaine  (XYLOCAINE ) 2 % solution Gargle and spit 5 mL every 6 hours as needed for throat pain or discomfort. 07/22/23  Yes Leath-Warren, Etta PARAS, NP  promethazine -dextromethorphan (PROMETHAZINE -DM) 6.25-15 MG/5ML syrup Take 5 mLs by mouth 4 (four) times daily as needed. 07/22/23  Yes Leath-Warren, Etta PARAS, NP  ibuprofen  (ADVIL ) 800 MG tablet Take 1 tablet (800 mg total) by mouth every 6 (six) hours as needed for moderate pain  (pain score 4-6). 05/21/23   Haze Lonni PARAS, MD    Family History Family History  Problem Relation Age of Onset   Heart attack Maternal Grandfather    Hypertension Paternal Grandfather    Heart attack Paternal Grandfather     Social History Social History   Tobacco Use   Smoking status: Some Days    Current packs/day: 0.50    Types: Cigarettes   Smokeless tobacco: Former    Types: Engineer, Drilling   Vaping status: Every Day   Substances: Nicotine  Substance Use Topics   Alcohol use: No    Alcohol/week: 0.0 standard drinks of alcohol   Drug use: Yes    Types: Marijuana    Comment: smokes marijuana daily     Allergies   Patient has no known allergies.   Review of Systems Review of Systems Per HPI  Physical Exam Triage Vital Signs ED Triage Vitals  Encounter Vitals Group     BP 07/22/23 1338 130/77     Systolic BP Percentile --      Diastolic BP Percentile --      Pulse Rate 07/22/23 1338 93     Resp 07/22/23 1338 18     Temp 07/22/23 1338 98.1 F (36.7 C)     Temp Source 07/22/23 1338 Oral     SpO2 07/22/23 1338 97 %     Weight --      Height --  Head Circumference --      Peak Flow --      Pain Score 07/22/23 1340 4     Pain Loc --      Pain Education --      Exclude from Growth Chart --    No data found.  Updated Vital Signs BP 130/77 (BP Location: Right Arm)   Pulse 93   Temp 98.1 F (36.7 C) (Oral)   Resp 18   SpO2 97%   Visual Acuity Right Eye Distance:   Left Eye Distance:   Bilateral Distance:    Right Eye Near:   Left Eye Near:    Bilateral Near:     Physical Exam Vitals and nursing note reviewed.  Constitutional:      General: He is not in acute distress.    Appearance: Normal appearance.  HENT:     Head: Normocephalic.     Right Ear: Tympanic membrane, ear canal and external ear normal.     Left Ear: Tympanic membrane, ear canal and external ear normal.     Nose: Congestion present.     Right Turbinates:  Enlarged and swollen.     Left Turbinates: Enlarged and swollen.     Right Sinus: No maxillary sinus tenderness or frontal sinus tenderness.     Left Sinus: No maxillary sinus tenderness or frontal sinus tenderness.     Mouth/Throat:     Lips: Pink.     Mouth: Mucous membranes are moist.     Pharynx: Uvula midline. Pharyngeal swelling, posterior oropharyngeal erythema and postnasal drip present. No oropharyngeal exudate or uvula swelling.     Tonsils: No tonsillar exudate or tonsillar abscesses. 1+ on the right. 1+ on the left.     Comments: Cobblestoning present to posterior oropharynx  Eyes:     Extraocular Movements: Extraocular movements intact.     Conjunctiva/sclera: Conjunctivae normal.     Pupils: Pupils are equal, round, and reactive to light.  Cardiovascular:     Rate and Rhythm: Normal rate and regular rhythm.     Pulses: Normal pulses.     Heart sounds: Normal heart sounds.  Pulmonary:     Effort: Pulmonary effort is normal. No respiratory distress.     Breath sounds: Normal breath sounds. No stridor. No wheezing, rhonchi or rales.  Abdominal:     General: Bowel sounds are normal.     Palpations: Abdomen is soft.     Tenderness: There is no abdominal tenderness.  Musculoskeletal:     Cervical back: Normal range of motion.  Lymphadenopathy:     Cervical: No cervical adenopathy.  Skin:    General: Skin is warm and dry.  Neurological:     General: No focal deficit present.     Mental Status: He is alert and oriented to person, place, and time.  Psychiatric:        Mood and Affect: Mood normal.        Behavior: Behavior normal.      UC Treatments / Results  Labs (all labs ordered are listed, but only abnormal results are displayed) Labs Reviewed  CULTURE, GROUP A STREP [facility])  POCT RAPID STREP A (OFFICE)  POCT INFLUENZA A/B    EKG   Radiology No results found.  Procedures Procedures (including critical care time)  Medications Ordered in  UC Medications - No data to display  Initial Impression / Assessment and Plan / UC Course  I have reviewed the triage vital signs and the nursing  notes.  Pertinent labs & imaging results that were available during my care of the patient were reviewed by me and considered in my medical decision making (see chart for details).  The rapid strep test and influenza test were negative.  A throat culture is pending.  You will be contacted if the pending test results are abnormal.  Symptoms consistent with a viral upper respiratory infection with cough.  Will provide symptomatic treatment with Bromfed-DM, viscous lidocaine  2% to gargle and spit for throat pain, and fluticasone  50 mcg nasal spray.  Supportive care recommendations were provided and discussed with the patient to include fluids, rest, and over-the-counter analgesics.  Discussed indications regarding follow-up.  Patient was in agreement with this plan of care and verbalizes understanding.  All questions were answered.  Patient stable for discharge.  Final Clinical Impressions(s) / UC Diagnoses   Final diagnoses:  Viral upper respiratory infection  Sore throat     Discharge Instructions      The rapid strep test and influenza test were negative.  A throat culture is pending.  You will be contacted if the pending test result is abnormal.  You also have access to the results via MyChart. Take medication as prescribed. Increase fluids and allow for plenty of rest. Recommend use of normal saline nasal spray as needed for nasal congestion and runny nose. Warm salt water gargles 3-4 times daily as needed for throat pain or discomfort. For your cough, recommend using a humidifier in your bedroom at nighttime during sleep and sleeping slightly elevated on pillows while cough symptoms persist. If symptoms do not improve over the next several days, or appear to be worsening, you may follow-up in this clinic or with your primary care physician for  further evaluation. Follow-up as needed.     ED Prescriptions     Medication Sig Dispense Auth. Provider   promethazine -dextromethorphan (PROMETHAZINE -DM) 6.25-15 MG/5ML syrup Take 5 mLs by mouth 4 (four) times daily as needed. 118 mL Leath-Warren, Etta PARAS, NP   fluticasone  (FLONASE ) 50 MCG/ACT nasal spray Place 2 sprays into both nostrils daily. 16 g Leath-Warren, Etta PARAS, NP   lidocaine  (XYLOCAINE ) 2 % solution Gargle and spit 5 mL every 6 hours as needed for throat pain or discomfort. 100 mL Leath-Warren, Etta PARAS, NP      PDMP not reviewed this encounter.   Gilmer Etta PARAS, NP 07/22/23 1447

## 2023-07-25 LAB — CULTURE, GROUP A STREP (THRC)

## 2024-08-01 ENCOUNTER — Emergency Department (HOSPITAL_COMMUNITY): Payer: Self-pay

## 2024-08-01 ENCOUNTER — Other Ambulatory Visit: Payer: Self-pay

## 2024-08-01 ENCOUNTER — Emergency Department (HOSPITAL_COMMUNITY)
Admission: EM | Admit: 2024-08-01 | Discharge: 2024-08-01 | Disposition: A | Discharge status: Home / Self Care | Payer: Self-pay | Attending: Emergency Medicine | Admitting: Emergency Medicine

## 2024-08-01 DIAGNOSIS — M25572 Pain in left ankle and joints of left foot: Secondary | ICD-10-CM | POA: Insufficient documentation

## 2024-08-01 MED ORDER — NAPROXEN 500 MG PO TABS: 500.0000 mg | ORAL_TABLET | Freq: Two times a day (BID) | ORAL | 0 refills | Status: DC

## 2024-08-01 MED ORDER — HYDROCODONE-ACETAMINOPHEN 5-325 MG PO TABS
1.0000 | ORAL_TABLET | Freq: Once | ORAL | Status: AC
  Administered 2024-08-01: 1 via ORAL
  Filled 2024-08-01: qty 1

## 2024-08-01 NOTE — ED Triage Notes (Signed)
 PT arrives to triage with complaints of LEFT foot pain that suddenly began today while bowling. Pt denies trauma or fall. Pt unsure of what caused his foot to suddenly begin hurting. Pt has full ROM, but pain with movement.

## 2024-08-01 NOTE — ED Provider Notes (Signed)
 " [place] [facility] Provider Note   CSN: [id number] Arrival date & time: 08/01/24  1849     Patient presents with: Foot Pain   Larry Reed is a 24 y.o. male.   24 year old male with no past medical history presents to the ED with a chief complaint of left foot pain of sudden onset.  Patient reports he was bowling when suddenly began to feel pain along the posterior aspect of his left ankle.  Exacerbated with weightbearing, along with any type of movement.  He did take some Motrin  x 4 to help with symptomatic treatment.  He has not been able to walk since the injury occurred.  He denies any falls, fevers, weakness.  The history is provided by the patient.  Foot Pain       Prior to Admission medications  Medication Sig Start Date End Date Taking? Authorizing Provider  naproxen  (NAPROSYN ) 500 MG tablet Take 1 tablet (500 mg total) by mouth 2 (two) times daily for 7 days. 08/01/24 08/08/24 Yes Treyveon Mochizuki, PA-C  fluticasone  (FLONASE ) 50 MCG/ACT nasal spray Place 2 sprays into both nostrils daily. 07/22/23   Leath-Warren, Etta PARAS, NP  ibuprofen  (ADVIL ) 800 MG tablet Take 1 tablet (800 mg total) by mouth every 6 (six) hours as needed for moderate pain (pain score 4-6). 05/21/23   Haze Lonni PARAS, MD  lidocaine  (XYLOCAINE ) 2 % solution Gargle and spit 5 mL every 6 hours as needed for throat pain or discomfort. 07/22/23   Leath-Warren, Etta PARAS, NP  promethazine -dextromethorphan (PROMETHAZINE -DM) 6.25-15 MG/5ML syrup Take 5 mLs by mouth 4 (four) times daily as needed. 07/22/23   Leath-Warren, Etta PARAS, NP    Allergies: Patient has no known allergies.    Review of Systems  Constitutional:  Negative for fever.  Musculoskeletal:  Positive for arthralgias.    Updated Vital Signs BP (!) 159/88 (BP Location: Right Arm)   Pulse 98   Temp 98.1 F (36.7 C) (Oral)   Resp 16   SpO2 100%   Physical Exam Vitals and nursing note reviewed.   Constitutional:      Appearance: Normal appearance.  HENT:     Head: Normocephalic and atraumatic.     Mouth/Throat:     Mouth: Mucous membranes are moist.  Cardiovascular:     Rate and Rhythm: Normal rate.  Pulmonary:     Effort: Pulmonary effort is normal.  Abdominal:     General: Abdomen is flat.  Musculoskeletal:     Cervical back: Normal range of motion and neck supple.     Left ankle: Swelling present. No ecchymosis or lacerations. Tenderness present over the posterior TF ligament. Normal range of motion. Normal pulse.  Skin:    General: Skin is warm and dry.  Neurological:     Mental Status: He is alert and oriented to person, place, and time.     (all labs ordered are listed, but only abnormal results are displayed) Labs Reviewed - No data to display  EKG: None  Radiology: DG Ankle Complete Left Result Date: 08/01/2024 CLINICAL DATA:  Left foot and ankle pain, bowling injury EXAM: LEFT ANKLE COMPLETE - 3+ VIEW; LEFT FOOT - COMPLETE 3+ VIEW COMPARISON:  None Available. FINDINGS: Left ankle: Frontal, oblique, and lateral views are obtained. No fracture, subluxation, or dislocation. Joint spaces are well preserved. Soft tissues are unremarkable. Left foot: Frontal, oblique, and lateral views of the left foot are obtained. No acute fracture, subluxation, or dislocation. Joint  spaces are well preserved. Soft tissues are unremarkable. IMPRESSION: 1. Unremarkable left foot and ankle. Electronically Signed   By: Ozell Daring M.D.   On: 08/01/2024 20:08   DG Foot Complete Left Result Date: 08/01/2024 CLINICAL DATA:  Left foot and ankle pain, bowling injury EXAM: LEFT ANKLE COMPLETE - 3+ VIEW; LEFT FOOT - COMPLETE 3+ VIEW COMPARISON:  None Available. FINDINGS: Left ankle: Frontal, oblique, and lateral views are obtained. No fracture, subluxation, or dislocation. Joint spaces are well preserved. Soft tissues are unremarkable. Left foot: Frontal, oblique, and lateral views of the  left foot are obtained. No acute fracture, subluxation, or dislocation. Joint spaces are well preserved. Soft tissues are unremarkable. IMPRESSION: 1. Unremarkable left foot and ankle. Electronically Signed   By: Ozell Daring M.D.   On: 08/01/2024 20:08     Procedures   Medications Ordered in the ED  HYDROcodone -acetaminophen  (NORCO/VICODIN) 5-325 MG per tablet 1 tablet (1 tablet Oral Given 08/01/24 2029)                                    Medical Decision Making Amount and/or Complexity of Data Reviewed Radiology: ordered.  Risk Prescription drug management.   Patient presented to the ED with sudden onset of left foot pain which began while bowling today.  He is unsure of any injury.  He is unsure whether he rolled his ankle or tells me that he did not fall.  He is having worsening pain with weightbearing along with any type of range of motion.  Given Norco while in the ED, will obtain x-rays for likely sprain versus strain.  Neurovascularly intact with good 2+ DP, PT pulses sensation is intact throughout.  However limited range of motion due to pain.   X-ray of the left ankle and foot without any acute findings at this time.  Patient provided with a copy of his report, will go home on a short course of anti-inflammatories.  Patient is hemodynamically stable for discharge.   Portions of this note were generated with Scientist, clinical (histocompatibility and immunogenetics). Dictation errors may occur despite best attempts at proofreading.   Final diagnoses:  Acute left ankle pain    ED Discharge Orders          Ordered    naproxen  (NAPROSYN ) 500 MG tablet  2 times daily        08/01/24 2201               Labron Bloodgood, PA-C 08/01/24 2202    Patt Alm Macho, MD 08/01/24 2216  "

## 2024-08-01 NOTE — Discharge Instructions (Addendum)
 You were given a short prescription for anti-inflammatories, please take 1 tablet twice a day for the next 7 days.  You will need to follow-up with orthopedist if your symptoms do not improve.

## 2024-08-02 ENCOUNTER — Emergency Department (HOSPITAL_COMMUNITY)
Admission: EM | Admit: 2024-08-02 | Discharge: 2024-08-03 | Disposition: A | Discharge status: Home / Self Care | Payer: Self-pay | Attending: Emergency Medicine | Admitting: Emergency Medicine

## 2024-08-02 ENCOUNTER — Other Ambulatory Visit: Payer: Self-pay

## 2024-08-02 ENCOUNTER — Encounter (HOSPITAL_COMMUNITY): Payer: Self-pay | Admitting: Emergency Medicine

## 2024-08-02 ENCOUNTER — Telehealth (HOSPITAL_COMMUNITY): Payer: Self-pay | Admitting: Emergency Medicine

## 2024-08-02 DIAGNOSIS — M25572 Pain in left ankle and joints of left foot: Secondary | ICD-10-CM | POA: Insufficient documentation

## 2024-08-02 MED ORDER — NAPROXEN 500 MG PO TABS: 500.0000 mg | ORAL_TABLET | Freq: Two times a day (BID) | ORAL | 0 refills | Status: AC

## 2024-08-02 NOTE — Telephone Encounter (Cosign Needed)
 Changed pharmacy to [facility] at patient' request.

## 2024-08-02 NOTE — ED Triage Notes (Signed)
" °  Patient comes in with L foot pain from injury while bowling yesterday.  Patient was seen last night for the same and given naproxen  for pain.  Patient states the pain has gotten worse and last took tylenol  before arrival.  Denies any numbness or tingling in foot.  Strong pedal pulses.  Pain 10/10, pressure/sharp.   "

## 2024-08-03 ENCOUNTER — Emergency Department (HOSPITAL_COMMUNITY): Payer: Self-pay

## 2024-08-03 MED ORDER — OXYCODONE-ACETAMINOPHEN 5-325 MG PO TABS
1.0000 | ORAL_TABLET | Freq: Once | ORAL | Status: AC
  Administered 2024-08-03: 1 via ORAL
  Filled 2024-08-03: qty 1

## 2024-08-03 MED ORDER — HYDROCODONE-ACETAMINOPHEN 5-325 MG PO TABS: 2.0000 | ORAL_TABLET | Freq: Four times a day (QID) | ORAL | 0 refills | Status: AC | PRN

## 2024-08-03 MED ORDER — HYDROCODONE-ACETAMINOPHEN 5-325 MG PO TABS
1.0000 | ORAL_TABLET | Freq: Once | ORAL | Status: AC
  Administered 2024-08-03: 1 via ORAL
  Filled 2024-08-03: qty 1

## 2024-08-03 NOTE — ED Provider Notes (Signed)
 " [place] EMERGENCY DEPARTMENT AT [facility] Provider Note   CSN: [id number] Arrival date & time: 08/02/24  2144     Patient presents with: Foot Pain   Larry Reed is a 24 y.o. male.  Patient with past medical history significant for obesity presents to the emergency department complaining of left-sided foot pain.  Patient was evaluated yesterday for the same complaint and prescribed Naprosyn  for pain.  He had negative x-rays at that time.  He is unsure about any sort of injury to the area.  He states he was bowling and had sudden pain in the foot.  He denies falling and denies any known twisting or rolling of the ankle.  He has a hard time pinpointing where the pain is between the ankle and the foot.  He states he took Naprosyn  3 times yesterday along with Tylenol  just prior to coming to the emergency department.  He denies numbness, tingling.  He complains of 10 out of 10 pain with movement and appears uncomfortable.     Foot Pain       Prior to Admission medications  Medication Sig Start Date End Date Taking? Authorizing Provider  HYDROcodone -acetaminophen  (NORCO/VICODIN) 5-325 MG tablet Take 2 tablets by mouth every 6 (six) hours as needed for up to 3 days. 08/03/24 08/06/24 Yes Logan Ubaldo NOVAK, PA-C  fluticasone  (FLONASE ) 50 MCG/ACT nasal spray Place 2 sprays into both nostrils daily. 07/22/23   Leath-Warren, Etta PARAS, NP  ibuprofen  (ADVIL ) 800 MG tablet Take 1 tablet (800 mg total) by mouth every 6 (six) hours as needed for moderate pain (pain score 4-6). 05/21/23   Haze Lonni PARAS, MD  lidocaine  (XYLOCAINE ) 2 % solution Gargle and spit 5 mL every 6 hours as needed for throat pain or discomfort. 07/22/23   Leath-Warren, Etta PARAS, NP  naproxen  (NAPROSYN ) 500 MG tablet Take 1 tablet (500 mg total) by mouth 2 (two) times daily. 08/02/24   Odell Balls, PA-C  promethazine -dextromethorphan (PROMETHAZINE -DM) 6.25-15 MG/5ML syrup Take 5 mLs by mouth 4 (four) times  daily as needed. 07/22/23   Leath-Warren, Etta PARAS, NP    Allergies: Patient has no known allergies.    Review of Systems  Updated Vital Signs BP (!) 152/91 (BP Location: Left Arm)   Pulse 74   Temp 97.7 F (36.5 C) (Oral)   Resp 18   Ht 5' 6 (1.676 m)   Wt (!) 149.7 kg   SpO2 96%   BMI 53.26 kg/m   Physical Exam Vitals and nursing note reviewed.  Constitutional:      Appearance: Normal appearance.  HENT:     Head: Normocephalic and atraumatic.     Mouth/Throat:     Mouth: Mucous membranes are moist.  Cardiovascular:     Rate and Rhythm: Normal rate.  Pulmonary:     Effort: Pulmonary effort is normal.  Abdominal:     General: Abdomen is flat.  Musculoskeletal:     Cervical back: Normal range of motion and neck supple.     Left ankle: Swelling present. No ecchymosis or lacerations. Tenderness present. Normal range of motion. Normal pulse.  Skin:    General: Skin is warm and dry.  Neurological:     Mental Status: He is alert and oriented to person, place, and time.     (all labs ordered are listed, but only abnormal results are displayed) Labs Reviewed - No data to display  EKG: None  Radiology: CT Foot Left Wo Contrast Result Date: 08/03/2024  EXAM: CT LEFT FOOT, WITHOUT IV CONTRAST 08/03/2024 02:31:21 AM TECHNIQUE: Axial images were acquired through the left foot without IV contrast. Reformatted images were reviewed. Automated exposure control, iterative reconstruction, and/or weight based adjustment of the mA/kV was utilized to reduce the radiation dose to as low as reasonably achievable. COMPARISON: Plain film from 08/01/2024. CLINICAL HISTORY: Bowling injury with persistent pain, initial encounter. FINDINGS: BONES AND JOINTS: Bony structures appear within normal limits. No acute fracture or focal osseous lesion. No dislocation. The joint spaces are normal. SOFT TISSUES: The soft tissues are unremarkable. Surrounding soft tissue structures show no focal hematoma.  IMPRESSION: 1. No acute osseous abnormality. Electronically signed by: Oneil Devonshire MD 08/03/2024 02:53 AM EST RP Workstation: [id number]   CT Ankle Left Wo Contrast Result Date: 08/03/2024 EXAM: CT LEFT ANKLE, WITHOUT IV CONTRAST 08/03/2024 02:31:21 AM TECHNIQUE: Axial images were acquired through the left ankle without IV contrast. Reformatted images were reviewed. Automated exposure control, iterative reconstruction, and/or weight based adjustment of the mA/kV was utilized to reduce the radiation dose to as low as reasonably achievable. COMPARISON: Plain film from 08/01/2024. CLINICAL HISTORY: Ankle/foot pain, out of proportion, negative x-rays. FINDINGS: BONES: No acute fracture is identified. A well-corticated accessory ossicle is noted posterior to the talus. No focal osseous lesion. JOINTS: No dislocation. The joint spaces are normal. No definitive ligamentous abnormality is noted. SOFT TISSUES: Surrounding soft tissue structures show no focal hematoma. Visualized musculature appears within normal limits. IMPRESSION: 1. No acute osseous abnormality. 2. No definitive ligamentous abnormality. Electronically signed by: Oneil Devonshire MD 08/03/2024 02:50 AM EST RP Workstation: [id number]   DG Ankle Complete Left Result Date: 08/01/2024 CLINICAL DATA:  Left foot and ankle pain, bowling injury EXAM: LEFT ANKLE COMPLETE - 3+ VIEW; LEFT FOOT - COMPLETE 3+ VIEW COMPARISON:  None Available. FINDINGS: Left ankle: Frontal, oblique, and lateral views are obtained. No fracture, subluxation, or dislocation. Joint spaces are well preserved. Soft tissues are unremarkable. Left foot: Frontal, oblique, and lateral views of the left foot are obtained. No acute fracture, subluxation, or dislocation. Joint spaces are well preserved. Soft tissues are unremarkable. IMPRESSION: 1. Unremarkable left foot and ankle. Electronically Signed   By: Ozell Daring M.D.   On: 08/01/2024 20:08   DG Foot Complete Left Result Date:  08/01/2024 CLINICAL DATA:  Left foot and ankle pain, bowling injury EXAM: LEFT ANKLE COMPLETE - 3+ VIEW; LEFT FOOT - COMPLETE 3+ VIEW COMPARISON:  None Available. FINDINGS: Left ankle: Frontal, oblique, and lateral views are obtained. No fracture, subluxation, or dislocation. Joint spaces are well preserved. Soft tissues are unremarkable. Left foot: Frontal, oblique, and lateral views of the left foot are obtained. No acute fracture, subluxation, or dislocation. Joint spaces are well preserved. Soft tissues are unremarkable. IMPRESSION: 1. Unremarkable left foot and ankle. Electronically Signed   By: Ozell Daring M.D.   On: 08/01/2024 20:08     Procedures   Medications Ordered in the ED  oxyCODONE -acetaminophen  (PERCOCET/ROXICET) 5-325 MG per tablet 1 tablet (1 tablet Oral Given 08/03/24 0116)  HYDROcodone -acetaminophen  (NORCO/VICODIN) 5-325 MG per tablet 1 tablet (1 tablet Oral Given 08/03/24 0432)                                    Medical Decision Making Amount and/or Complexity of Data Reviewed Radiology: ordered.  Risk Prescription drug management.   This patient presents to the ED for concern of foot/ankle pain, this  involves an extensive number of treatment options, and is a complaint that carries with it a high risk of complications and morbidity.  The differential diagnosis includes fracture, dislocation, ligamentous injury, other soft tissue injury, compartment syndrome, others   Co morbidities / Chronic conditions that complicate the patient evaluation  Obesity   Additional history obtained:  Additional history obtained from EMR   Imaging Studies ordered:  I ordered imaging studies including CT scans of the left foot and ankle I independently visualized and interpreted imaging which showed no acute findings I agree with the radiologist interpretation    Problem List / ED Course / Critical interventions / Medication management   I ordered medication including  Norco Reevaluation of the patient after these medicines showed that the patient improved I have reviewed the patients home medicines and have made adjustments as needed   Social Determinants of Health:  Patient is an occasional smoker   Test / Admission - Considered:  No acute findings on imaging to explain patient's symptoms.  His compartments are soft and nontender.  No signs of compartment syndrome.  Unclear as to why the patient is having this pain in the left foot/ankle.  At this time plan to have patient follow-up with orthopedic surgery.  He was provided with a cam boot and crutches.  Patient will continue to take Naprosyn  and Tylenol  for pain control at home.  Return precautions provided.       Final diagnoses:  Acute left ankle pain    ED Discharge Orders          Ordered    HYDROcodone -acetaminophen  (NORCO/VICODIN) 5-325 MG tablet  Every 6 hours PRN        08/03/24 0443               Logan Ubaldo NOVAK, PA-C 08/03/24 0444    Theadore Ozell HERO, MD 08/03/24 [phone]  "

## 2024-08-03 NOTE — Discharge Instructions (Signed)
 Your CT scan showed no fracture or dislocation.  Please use the walking boot and crutches.  He is weightbearing in the left lower extremity and schedule a follow-up appointment with orthopedics for further management.  You may continue to take the prescribed Naprosyn .  I also recommend taking Tylenol , not to exceed 4000 mg in 24 hours.  Return to the emergency department if you develop any life-threatening symptoms.
